# Patient Record
Sex: Male | Born: 1980 | Race: White | Hispanic: No | Marital: Single | Smoking: Never smoker
Health system: Southern US, Community
[De-identification: ages and names within clinical notes are randomized; demographics above are authoritative.]

## PROBLEM LIST (undated history)

## (undated) HISTORY — PX: TONSILLECTOMY: SUR1361

## (undated) HISTORY — PX: WISDOM TOOTH EXTRACTION: SHX21

---

## 2014-06-27 ENCOUNTER — Emergency Department (HOSPITAL_COMMUNITY): Payer: Self-pay

## 2014-06-27 ENCOUNTER — Emergency Department (HOSPITAL_COMMUNITY)
Admission: EM | Admit: 2014-06-27 | Discharge: 2014-06-27 | Disposition: A | Discharge status: Home / Self Care | Payer: Self-pay | Attending: Emergency Medicine | Admitting: Emergency Medicine

## 2014-06-27 ENCOUNTER — Encounter (HOSPITAL_COMMUNITY): Payer: Self-pay | Admitting: Emergency Medicine

## 2014-06-27 DIAGNOSIS — R05 Cough: Secondary | ICD-10-CM | POA: Insufficient documentation

## 2014-06-27 DIAGNOSIS — J069 Acute upper respiratory infection, unspecified: Secondary | ICD-10-CM | POA: Insufficient documentation

## 2014-06-27 DIAGNOSIS — R059 Cough, unspecified: Secondary | ICD-10-CM | POA: Insufficient documentation

## 2014-06-27 DIAGNOSIS — H9209 Otalgia, unspecified ear: Secondary | ICD-10-CM | POA: Insufficient documentation

## 2014-06-27 DIAGNOSIS — B9789 Other viral agents as the cause of diseases classified elsewhere: Secondary | ICD-10-CM

## 2014-06-27 MED ORDER — OXYMETAZOLINE HCL 0.05 % NA SOLN
1.0000 | Freq: Once | NASAL | Status: AC
  Administered 2014-06-27: 1 via NASAL
  Filled 2014-06-27: qty 15

## 2014-06-27 MED ORDER — BENZONATATE 100 MG PO CAPS: 100.0000 mg | ORAL_CAPSULE | Freq: Three times a day (TID) | ORAL | Status: AC

## 2014-06-27 NOTE — ED Notes (Signed)
Pt states for 2 days he has had cough, congestion and earache.  Also c/o lower bilateral rib cage pain.  Increased home vitamins without relief.  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  Feels fatigued.

## 2014-06-27 NOTE — ED Provider Notes (Signed)
CSN: [id number]     Arrival date & time 06/27/14  1738 History  This chart was scribed for non-physician practitioner, Jaynie Crumble, PA-C working with Merrie Roof, MD by Greggory Stallion, ED scribe. This patient was seen in room WTR8/WTR8 and the patient's care was started at 6:30 PM.   Chief Complaint  Patient presents with  . Cough  . Otalgia   The history is provided by the patient. No language interpreter was used.   HPI Comments: Andres Hill is a 33 y.o. male who presents to the Emergency Department complaining of cough, congestion and ear pain that started 2 days ago. Symptoms worsened yesterday. States generalized body aches and bilateral lower rib pain started earlier today. He had a fever of 102 earlier this morning. Pt has taken tylenol cold and flu, Dayquil and Nyquil with some relief. His last dose of Dayquil was around noon today. Reports mild sore throat yesterday but denies it today. Pt does not smoke cigarettes daily.   History reviewed. No pertinent past medical history. History reviewed. No pertinent past surgical history. History reviewed. No pertinent family history. History  Substance Use Topics  . Smoking status: Never Smoker   . Smokeless tobacco: Not on file  . Alcohol Use: Yes     Comment: occasional    Review of Systems  Constitutional: Positive for fever.  HENT: Positive for congestion and ear pain. Negative for sore throat.   Respiratory: Positive for cough.   Musculoskeletal: Positive for myalgias.  All other systems reviewed and are negative.  Allergies  Review of patient's allergies indicates no known allergies.  Home Medications   Prior to Admission medications   Not on File   BP 108/76  Pulse 65  Temp(Src) 98.4 F (36.9 C) (Oral)  Resp 18  SpO2 100%  Physical Exam  Nursing note and vitals reviewed. Constitutional: He is oriented to person, place, and time. He appears well-developed and well-nourished. No distress.  HENT:   Head: Normocephalic and atraumatic.  Right Ear: Tympanic membrane and ear canal normal.  Left Ear: Tympanic membrane and ear canal normal.  Nose: Mucosal edema and rhinorrhea present.  Mouth/Throat: Uvula is midline, oropharynx is clear and moist and mucous membranes are normal.  Eyes: Conjunctivae and EOM are normal.  Neck: Neck supple. No tracheal deviation present.  Cardiovascular: Normal rate, regular rhythm and normal heart sounds.   Pulmonary/Chest: Effort normal and breath sounds normal. No respiratory distress. He has no wheezes. He has no rhonchi. He has no rales.  Musculoskeletal: Normal range of motion.  Lymphadenopathy:    He has no cervical adenopathy.  Neurological: He is alert and oriented to person, place, and time.  Skin: Skin is warm and dry.  Psychiatric: He has a normal mood and affect. His behavior is normal.    ED Course  Procedures (including critical care time)  DIAGNOSTIC STUDIES: Oxygen Saturation is 100% on RA, normal by my interpretation.    COORDINATION OF CARE: 6:33 PM-Discussed treatment plan which includes chest xray with pt at bedside and pt agreed to plan.   Labs Review Labs Reviewed - No data to display  Imaging Review Dg Chest 2 View  06/27/2014   CLINICAL DATA:  Cough, abdominal cramping  EXAM: CHEST  2 VIEW  COMPARISON:  None  FINDINGS: The heart size and mediastinal contours are within normal limits. Both lungs are clear. The visualized skeletal structures are unremarkable.  IMPRESSION: No active cardiopulmonary disease.   Electronically Signed  By: Natasha MeadLiviu  Pop M.D.   On: 06/27/2014 19:23     EKG Interpretation None      MDM   Final diagnoses:  Viral URI with cough    Pt with with nasal congestion, cough, sinus pressure.  CXR negative. VS normal. Suspect viral URI. Home with afrin, PO fluids, tessalon. Pt denies sore throat, doubt strep pharyngitis. He is afebrile. No distress, non toxic. No sings of meningismus.   Filed Vitals:    06/27/14 1818  BP: 108/76  Pulse: 65  Temp: 98.4 F (36.9 C)  TempSrc: Oral  Resp: 18  SpO2: 100%    I personally performed the services described in this documentation, which was scribed in my presence. The recorded information has been reviewed and is accurate.  Lottie Musselatyana A Shandel Busic, PA-C 06/27/14 2031

## 2014-06-27 NOTE — Discharge Instructions (Signed)
Tessalon perles for cough. Continue dayquil and nyquil. Rest. Fluids. Afrin twice a day for 3 days only. Follow up with primary care doctor or urgent care    Upper Respiratory Infection, Adult An upper respiratory infection (URI) is also sometimes known as the common cold. The upper respiratory tract includes the nose, sinuses, throat, trachea, and bronchi. Bronchi are the airways leading to the lungs. Most people improve within 1 week, but symptoms can last up to 2 weeks. A residual cough may last even longer.  CAUSES Many different viruses can infect the tissues lining the upper respiratory tract. The tissues become irritated and inflamed and often become very moist. Mucus production is also common. A cold is contagious. You can easily spread the virus to others by oral contact. This includes kissing, sharing a glass, coughing, or sneezing. Touching your mouth or nose and then touching a surface, which is then touched by another person, can also spread the virus. SYMPTOMS  Symptoms typically develop 1 to 3 days after you come in contact with a cold virus. Symptoms vary from person to person. They may include:  Runny nose.  Sneezing.  Nasal congestion.  Sinus irritation.  Sore throat.  Loss of voice (laryngitis).  Cough.  Fatigue.  Muscle aches.  Loss of appetite.  Headache.  Low-grade fever. DIAGNOSIS  You might diagnose your own cold based on familiar symptoms, since most people get a cold 2 to 3 times a year. Your caregiver can confirm this based on your exam. Most importantly, your caregiver can check that your symptoms are not due to another disease such as strep throat, sinusitis, pneumonia, asthma, or epiglottitis. Blood tests, throat tests, and X-rays are not necessary to diagnose a common cold, but they may sometimes be helpful in excluding other more serious diseases. Your caregiver will decide if any further tests are required. RISKS AND COMPLICATIONS  You may be at  risk for a more severe case of the common cold if you smoke cigarettes, have chronic heart disease (such as heart failure) or lung disease (such as asthma), or if you have a weakened immune system. The very young and very old are also at risk for more serious infections. Bacterial sinusitis, middle ear infections, and bacterial pneumonia can complicate the common cold. The common cold can worsen asthma and chronic obstructive pulmonary disease (COPD). Sometimes, these complications can require emergency medical care and may be life-threatening. PREVENTION  The best way to protect against getting a cold is to practice good hygiene. Avoid oral or hand contact with people with cold symptoms. Wash your hands often if contact occurs. There is no clear evidence that vitamin C, vitamin E, echinacea, or exercise reduces the chance of developing a cold. However, it is always recommended to get plenty of rest and practice good nutrition. TREATMENT  Treatment is directed at relieving symptoms. There is no cure. Antibiotics are not effective, because the infection is caused by a virus, not by bacteria. Treatment may include:  Increased fluid intake. Sports drinks offer valuable electrolytes, sugars, and fluids.  Breathing heated mist or steam (vaporizer or shower).  Eating chicken soup or other clear broths, and maintaining good nutrition.  Getting plenty of rest.  Using gargles or lozenges for comfort.  Controlling fevers with ibuprofen or acetaminophen as directed by your caregiver.  Increasing usage of your inhaler if you have asthma. Zinc gel and zinc lozenges, taken in the first 24 hours of the common cold, can shorten the duration and lessen  the severity of symptoms. Pain medicines may help with fever, muscle aches, and throat pain. A variety of non-prescription medicines are available to treat congestion and runny nose. Your caregiver can make recommendations and may suggest nasal or lung inhalers for  other symptoms.  HOME CARE INSTRUCTIONS   Only take over-the-counter or prescription medicines for pain, discomfort, or fever as directed by your caregiver.  Use a warm mist humidifier or inhale steam from a shower to increase air moisture. This may keep secretions moist and make it easier to breathe.  Drink enough water and fluids to keep your urine clear or pale yellow.  Rest as needed.  Return to work when your temperature has returned to normal or as your caregiver advises. You may need to stay home longer to avoid infecting others. You can also use a face mask and careful hand washing to prevent spread of the virus. SEEK MEDICAL CARE IF:   After the first few days, you feel you are getting worse rather than better.  You need your caregiver's advice about medicines to control symptoms.  You develop chills, worsening shortness of breath, or brown or red sputum. These may be signs of pneumonia.  You develop yellow or brown nasal discharge or pain in the face, especially when you bend forward. These may be signs of sinusitis.  You develop a fever, swollen neck glands, pain with swallowing, or white areas in the back of your throat. These may be signs of strep throat. SEEK IMMEDIATE MEDICAL CARE IF:   You have a fever.  You develop severe or persistent headache, ear pain, sinus pain, or chest pain.  You develop wheezing, a prolonged cough, cough up blood, or have a change in your usual mucus (if you have chronic lung disease).  You develop sore muscles or a stiff neck. Document Released: 04/21/2001 Document Revised: 01/18/2012 Document Reviewed: 01/31/2014 [facility] Patient Information 2015 [place], [place]. This information is not intended to replace advice given to you by your health care provider. Make sure you discuss any questions you have with your health care provider.

## 2014-06-28 NOTE — ED Provider Notes (Signed)
Medical screening examination/treatment/procedure(s) were performed by non-physician practitioner and as supervising physician I was immediately available for consultation/collaboration.   EKG Interpretation None        Andres Hill David Yomaira Solar III, MD 06/28/14 0020 

## 2014-12-26 ENCOUNTER — Emergency Department (HOSPITAL_COMMUNITY)
Admission: EM | Admit: 2014-12-26 | Discharge: 2014-12-26 | Disposition: A | Discharge status: Home / Self Care | Payer: Self-pay | Attending: Emergency Medicine | Admitting: Emergency Medicine

## 2014-12-26 ENCOUNTER — Encounter (HOSPITAL_COMMUNITY): Payer: Self-pay | Admitting: Emergency Medicine

## 2014-12-26 DIAGNOSIS — M549 Dorsalgia, unspecified: Secondary | ICD-10-CM

## 2014-12-26 DIAGNOSIS — X58XXXA Exposure to other specified factors, initial encounter: Secondary | ICD-10-CM | POA: Insufficient documentation

## 2014-12-26 DIAGNOSIS — Z79899 Other long term (current) drug therapy: Secondary | ICD-10-CM | POA: Insufficient documentation

## 2014-12-26 DIAGNOSIS — Y998 Other external cause status: Secondary | ICD-10-CM | POA: Insufficient documentation

## 2014-12-26 DIAGNOSIS — Y9289 Other specified places as the place of occurrence of the external cause: Secondary | ICD-10-CM | POA: Insufficient documentation

## 2014-12-26 DIAGNOSIS — S46812A Strain of other muscles, fascia and tendons at shoulder and upper arm level, left arm, initial encounter: Secondary | ICD-10-CM | POA: Insufficient documentation

## 2014-12-26 DIAGNOSIS — M62838 Other muscle spasm: Secondary | ICD-10-CM | POA: Insufficient documentation

## 2014-12-26 DIAGNOSIS — Y9389 Activity, other specified: Secondary | ICD-10-CM | POA: Insufficient documentation

## 2014-12-26 DIAGNOSIS — M546 Pain in thoracic spine: Secondary | ICD-10-CM | POA: Insufficient documentation

## 2014-12-26 LAB — I-STAT CHEM 8, ED
BUN: 14 mg/dL (ref 6–23)
CHLORIDE: 99 mmol/L (ref 96–112)
CREATININE: 0.9 mg/dL (ref 0.50–1.35)
Calcium, Ion: 1.21 mmol/L (ref 1.12–1.23)
GLUCOSE: 132 mg/dL — AB (ref 70–99)
HCT: 51 % (ref 39.0–52.0)
Hemoglobin: 17.3 g/dL — ABNORMAL HIGH (ref 13.0–17.0)
Potassium: 3.8 mmol/L (ref 3.5–5.1)
Sodium: 140 mmol/L (ref 135–145)
TCO2: 24 mmol/L (ref 0–100)

## 2014-12-26 LAB — CK: Total CK: 110 U/L (ref 7–232)

## 2014-12-26 MED ORDER — METHOCARBAMOL 500 MG PO TABS: 500.0000 mg | ORAL_TABLET | Freq: Two times a day (BID) | ORAL | Status: AC | PRN

## 2014-12-26 MED ORDER — HYDROMORPHONE HCL 1 MG/ML IJ SOLN
0.5000 mg | Freq: Once | INTRAMUSCULAR | Status: AC
  Administered 2014-12-26: 0.5 mg via INTRAVENOUS
  Filled 2014-12-26: qty 1

## 2014-12-26 MED ORDER — NAPROXEN 500 MG PO TABS: 500.0000 mg | ORAL_TABLET | Freq: Two times a day (BID) | ORAL | Status: AC

## 2014-12-26 MED ORDER — DIAZEPAM 5 MG/ML IJ SOLN
2.5000 mg | Freq: Once | INTRAMUSCULAR | Status: AC
  Administered 2014-12-26: 2.5 mg via INTRAVENOUS
  Filled 2014-12-26: qty 2

## 2014-12-26 MED ORDER — OXYCODONE-ACETAMINOPHEN 5-325 MG PO TABS: 1.0000 | ORAL_TABLET | Freq: Four times a day (QID) | ORAL | Status: AC | PRN

## 2014-12-26 MED ORDER — KETOROLAC TROMETHAMINE 30 MG/ML IJ SOLN
30.0000 mg | Freq: Once | INTRAMUSCULAR | Status: AC
  Administered 2014-12-26: 30 mg via INTRAVENOUS
  Filled 2014-12-26: qty 1

## 2014-12-26 NOTE — ED Notes (Signed)
Pt ambulatory and states he is ready to go home.

## 2014-12-26 NOTE — ED Notes (Addendum)
Pt c/o  Left upper back pain/spasm that moves down under arm pit and on bicep and tricep of left arm.  Pt has visual twitching of muscles in triage chair in left arm.

## 2014-12-26 NOTE — ED Notes (Signed)
Nurse currently starting IV 

## 2014-12-26 NOTE — Discharge Instructions (Signed)
Muscle Strain A muscle strain is an injury that occurs when a muscle is stretched beyond its normal length. Usually a small number of muscle fibers are torn when this happens. Muscle strain is rated in degrees. First-degree strains have the least amount of muscle fiber tearing and pain. Second-degree and third-degree strains have increasingly more tearing and pain.  Usually, recovery from muscle strain takes 1-2 weeks. Complete healing takes 5-6 weeks.  CAUSES  Muscle strain happens when a sudden, violent force placed on a muscle stretches it too far. This may occur with lifting, sports, or a fall.  RISK FACTORS Muscle strain is especially common in athletes.  SIGNS AND SYMPTOMS At the site of the muscle strain, there may be:  Pain.  Bruising.  Swelling.  Difficulty using the muscle due to pain or lack of normal function. DIAGNOSIS  Your health care provider will perform a physical exam and ask about your medical history. TREATMENT  Often, the best treatment for a muscle strain is resting, icing, and applying cold compresses to the injured area.  HOME CARE INSTRUCTIONS   Use the PRICE method of treatment to promote muscle healing during the first 2-3 days after your injury. The PRICE method involves:  Protecting the muscle from being injured again.  Restricting your activity and resting the injured body part.  Icing your injury. To do this, put ice in a plastic bag. Place a towel between your skin and the bag. Then, apply the ice and leave it on from 15-20 minutes each hour. After the third day, switch to moist heat packs.  Apply compression to the injured area with a splint or elastic bandage. Be careful not to wrap it too tightly. This may interfere with blood circulation or increase swelling.  Elevate the injured body part above the level of your heart as often as you can.  Only take over-the-counter or prescription medicines for pain, discomfort, or fever as directed by your  health care provider.  Warming up prior to exercise helps to prevent future muscle strains. SEEK MEDICAL CARE IF:   You have increasing pain or swelling in the injured area.  You have numbness, tingling, or a significant loss of strength in the injured area. MAKE SURE YOU:   Understand these instructions.  Will watch your condition.  Will get help right away if you are not doing well or get worse. Document Released: 10/26/2005 Document Revised: 08/16/2013 Document Reviewed: 05/25/2013 [facility] Patient Information 2015 [place], [place]. This information is not intended to replace advice given to you by your health care provider. Make sure you discuss any questions you have with your health care provider. Muscle Cramps and Spasms Muscle cramps and spasms occur when a muscle or muscles tighten and you have no control over this tightening (involuntary muscle contraction). They are a common problem and can develop in any muscle. The most common place is in the calf muscles of the leg. Both muscle cramps and muscle spasms are involuntary muscle contractions, but they also have differences:   Muscle cramps are sporadic and painful. They may last a few seconds to a quarter of an hour. Muscle cramps are often more forceful and last longer than muscle spasms.  Muscle spasms may or may not be painful. They may also last just a few seconds or much longer. CAUSES  It is uncommon for cramps or spasms to be due to a serious underlying problem. In many cases, the cause of cramps or spasms is unknown. Some common  causes are:   Overexertion.   Overuse from repetitive motions (doing the same thing over and over).   Remaining in a certain position for a long period of time.   Improper preparation, form, or technique while performing a sport or activity.   Dehydration.   Injury.   Side effects of some medicines.   Abnormally low levels of the salts and ions in your blood (electrolytes),  especially potassium and calcium. This could happen if you are taking water pills (diuretics) or you are pregnant.  Some underlying medical problems can make it more likely to develop cramps or spasms. These include, but are not limited to:   Diabetes.   Parkinson disease.   Hormone disorders, such as thyroid problems.   Alcohol abuse.   Diseases specific to muscles, joints, and bones.   Blood vessel disease where not enough blood is getting to the muscles.  HOME CARE INSTRUCTIONS   Stay well hydrated. Drink enough water and fluids to keep your urine clear or pale yellow.  It may be helpful to massage, stretch, and relax the affected muscle.  For tight or tense muscles, use a warm towel, heating pad, or hot shower water directed to the affected area.  If you are sore or have pain after a cramp or spasm, applying ice to the affected area may relieve discomfort.  Put ice in a plastic bag.  Place a towel between your skin and the bag.  Leave the ice on for 15-20 minutes, 03-04 times a day.  Medicines used to treat a known cause of cramps or spasms may help reduce their frequency or severity. Only take over-the-counter or prescription medicines as directed by your caregiver. SEEK MEDICAL CARE IF:  Your cramps or spasms get more severe, more frequent, or do not improve over time.  MAKE SURE YOU:   Understand these instructions.  Will watch your condition.  Will get help right away if you are not doing well or get worse. Document Released: 04/17/2002 Document Revised: 02/20/2013 Document Reviewed: 10/12/2012 [facility] CenterExitCare Patient Information 2015 [place], [place]. This information is not intended to replace advice given to you by your health care provider. Make sure you discuss any questions you have with your health care provider.

## 2014-12-26 NOTE — ED Provider Notes (Signed)
CSN: [id number]     Arrival date & time 12/26/14  1906 History  This chart was scribed for non-physician practitioner, Antony MaduraKelly Zainab Crumrine, PA working with Suzi RootsKevin E Steinl, MD by Gwenyth Oberatherine Macek, ED scribe. This patient was seen in room WTR8/WTR8 and the patient's care was started at 8:14 PM  Chief Complaint  Patient presents with  . Back Pain  . Arm Pain   The history is provided by the patient. No language interpreter was used.    HPI Comments: Andres PoissonSteven Hill is a 34 y.o. male who presents to the Emergency Department complaining of constant, gradually worsening, sharp left upper back pain that radiates to his left arm and started this morning. Pt notes an intermittent pulsating sensation in his left arm muscles and mild cough that started 1 month ago as associated symptoms. He reports pain becomes worse with muscle spasms, pushing and pulling with his left arm and hyperextension of his neck. He denies alleviating factors. Pt has tried rest, neck stretches, epsom salt baths and 1000 mg of Ibuprofen with no relief. He denies recent injury, but notes increased stress in the last week. Pt drinks 1 gallon of water daily. Pt has a history of a partial bicep tear in left arm that occurred a few years ago and was treated with rehab. He denies fever and numbness as associated symptoms.  History reviewed. No pertinent past medical history. History reviewed. No pertinent past surgical history. No family history on file. History  Substance Use Topics  . Smoking status: Never Smoker   . Smokeless tobacco: Not on file  . Alcohol Use: Yes     Comment: occasional    Review of Systems  Constitutional: Negative for fever.  Musculoskeletal: Positive for myalgias and back pain.  Skin: Negative for wound.  Neurological: Negative for numbness.  All other systems reviewed and are negative.   Allergies  Review of patient's allergies indicates no known allergies.  Home Medications   Prior to Admission medications    Medication Sig Start Date End Date Taking? Authorizing Provider  ibuprofen (ADVIL,MOTRIN) 200 MG tablet Take 400 mg by mouth every 6 (six) hours as needed for moderate pain.   Yes Historical Provider, MD  benzonatate (TESSALON) 100 MG capsule Take 1 capsule (100 mg total) by mouth every 8 (eight) hours. Patient not taking: Reported on 12/26/2014 06/27/14   Tatyana A Kirichenko, PA-C  methocarbamol (ROBAXIN) 500 MG tablet Take 1 tablet (500 mg total) by mouth every 12 (twelve) hours as needed for muscle spasms. 12/26/14   Antony MaduraKelly Ashlinn Hemrick, PA-C  naproxen (NAPROSYN) 500 MG tablet Take 1 tablet (500 mg total) by mouth 2 (two) times daily. 12/26/14   Antony MaduraKelly Bryndan Bilyk, PA-C  oxyCODONE-acetaminophen (PERCOCET/ROXICET) 5-325 MG per tablet Take 1-2 tablets by mouth every 6 (six) hours as needed for moderate pain or severe pain. 12/26/14   Antony MaduraKelly Ludivina Guymon, PA-C   BP 118/73 mmHg  Pulse 68  Temp(Src) 98.3 F (36.8 C) (Oral)  Resp 14  Ht 5\' 10"  (1.778 m)  Wt 167 lb (75.751 kg)  BMI 23.96 kg/m2  SpO2 98%   Physical Exam  Constitutional: He is oriented to person, place, and time. He appears well-developed and well-nourished. No distress.  Nontoxic/nonseptic appearing  HENT:  Head: Normocephalic and atraumatic.  Eyes: Conjunctivae and EOM are normal. No scleral icterus.  Neck: Normal range of motion.  Cardiovascular: Normal rate, regular rhythm and intact distal pulses.   Distal radial pulse 2+ bilaterally  Pulmonary/Chest: Effort normal. No respiratory distress.  Respirations even and unlabored  Musculoskeletal: Normal range of motion. He exhibits tenderness.       Back:  There is to palpation along the course of the left trapezius muscle. No tenderness to the cervical, thoracic, or lumbar midline. No bony deformities, step-offs, or crepitus. Involuntary muscle spasms noted to various points of the left upper extremity. No tenderness to the proximal or distal left arm.  Neurological: He is alert and oriented to  person, place, and time. He exhibits normal muscle tone. Coordination normal.  Sensation to light touch intact. Equal grip strength bilaterally. Strength against resistance 4/5 in the left upper extremity compared to right. Suspect that this is secondary to discomfort as patient appears to be in pain when performing these tests.  Skin: Skin is warm and dry. No rash noted. He is not diaphoretic. No erythema. No pallor.  Psychiatric: He has a normal mood and affect. His behavior is normal.  Nursing note and vitals reviewed.   ED Course  Procedures (including critical care time) DIAGNOSTIC STUDIES: Oxygen Saturation is 100% on RA, normal by my interpretation.    COORDINATION OF CARE: 8:26 PM Discussed treatment plan with pt which includes lab work. He agreed to plan.  Labs Review Labs Reviewed  I-STAT CHEM 8, ED - Abnormal; Notable for the following:    Glucose, Bld 132 (*)    Hemoglobin 17.3 (*)    All other components within normal limits  CK   Imaging Review No results found.   EKG Interpretation None      MDM   Final diagnoses:  Muscle spasm  Trapezius strain, left, initial encounter  Upper back pain on left side    34 year old male presents to the emergency department for further evaluation of pain to his left upper back. No known trauma or injury. No bony tenderness or deformity. Patient is neurovascularly intact. He has tenderness along the course of the left trapezius muscle. No tenderness to the left upper extremity, but involuntary spasms are noted sporadically. Patient has no electrolyte balance today. CK is normal. Symptoms treated in ED with Valium, Dilaudid, and Toradol as well as heat pack. No indication for further emergent workup at this time. Will continue pain control as outpatient with Percocet, naproxen, and Robaxin. Return precautions provided. Patient discharged in good condition with no unaddressed concerns.  I personally performed the services described  in this documentation, which was scribed in my presence. The recorded information has been reviewed and is accurate.   Filed Vitals:   12/26/14 1913 12/26/14 2153  BP: 116/90 118/73  Pulse: 82 68  Temp: 98 F (36.7 C) 98.3 F (36.8 C)  TempSrc: Oral Oral  Resp: 20 14  Height:  (1.778 m)   Weight: 167 lb (75.751 kg)   SpO2: 100% 98%     Antony Madura, PA-C 12/27/14 0006  Suzi Roots, MD 12/29/14 [phone]

## 2014-12-27 ENCOUNTER — Emergency Department (HOSPITAL_COMMUNITY): Payer: Self-pay

## 2014-12-27 ENCOUNTER — Emergency Department (HOSPITAL_COMMUNITY)
Admission: EM | Admit: 2014-12-27 | Discharge: 2014-12-27 | Disposition: A | Discharge status: Home / Self Care | Payer: Self-pay | Attending: Emergency Medicine | Admitting: Emergency Medicine

## 2014-12-27 ENCOUNTER — Encounter (HOSPITAL_COMMUNITY): Payer: Self-pay | Admitting: Emergency Medicine

## 2014-12-27 DIAGNOSIS — M62838 Other muscle spasm: Secondary | ICD-10-CM

## 2014-12-27 DIAGNOSIS — M549 Dorsalgia, unspecified: Secondary | ICD-10-CM | POA: Insufficient documentation

## 2014-12-27 DIAGNOSIS — Z79899 Other long term (current) drug therapy: Secondary | ICD-10-CM | POA: Insufficient documentation

## 2014-12-27 MED ORDER — KETOROLAC TROMETHAMINE 30 MG/ML IJ SOLN
30.0000 mg | Freq: Once | INTRAMUSCULAR | Status: AC
  Administered 2014-12-27: 30 mg via INTRAMUSCULAR
  Filled 2014-12-27: qty 1

## 2014-12-27 NOTE — Discharge Instructions (Signed)
Muscle Cramps and Spasms Muscle cramps and spasms occur when a muscle or muscles tighten and you have no control over this tightening (involuntary muscle contraction). They are a common problem and can develop in any muscle. The most common place is in the calf muscles of the leg. Both muscle cramps and muscle spasms are involuntary muscle contractions, but they also have differences:   Muscle cramps are sporadic and painful. They may last a few seconds to a quarter of an hour. Muscle cramps are often more forceful and last longer than muscle spasms.  Muscle spasms may or may not be painful. They may also last just a few seconds or much longer. CAUSES  It is uncommon for cramps or spasms to be due to a serious underlying problem. In many cases, the cause of cramps or spasms is unknown. Some common causes are:   Overexertion.   Overuse from repetitive motions (doing the same thing over and over).   Remaining in a certain position for a long period of time.   Improper preparation, form, or technique while performing a sport or activity.   Dehydration.   Injury.   Side effects of some medicines.   Abnormally low levels of the salts and ions in your blood (electrolytes), especially potassium and calcium. This could happen if you are taking water pills (diuretics) or you are pregnant.  Some underlying medical problems can make it more likely to develop cramps or spasms. These include, but are not limited to:   Diabetes.   Parkinson disease.   Hormone disorders, such as thyroid problems.   Alcohol abuse.   Diseases specific to muscles, joints, and bones.   Blood vessel disease where not enough blood is getting to the muscles.  HOME CARE INSTRUCTIONS   Stay well hydrated. Drink enough water and fluids to keep your urine clear or pale yellow.  It may be helpful to massage, stretch, and relax the affected muscle.  For tight or tense muscles, use a warm towel, heating  pad, or hot shower water directed to the affected area.  If you are sore or have pain after a cramp or spasm, applying ice to the affected area may relieve discomfort.  Put ice in a plastic bag.  Place a towel between your skin and the bag.  Leave the ice on for 15-20 minutes, 03-04 times a day.  Medicines used to treat a known cause of cramps or spasms may help reduce their frequency or severity. Only take over-the-counter or prescription medicines as directed by your caregiver. SEEK MEDICAL CARE IF:  Your cramps or spasms get more severe, more frequent, or do not improve over time.  MAKE SURE YOU:   Understand these instructions.  Will watch your condition.  Will get help right away if you are not doing well or get worse. Document Released: 04/17/2002 Document Revised: 02/20/2013 Document Reviewed: 10/12/2012 [facility] Patient Information 2015 [place], [place]. This information is not intended to replace advice given to you by your health care provider. Make sure you discuss any questions you have with your health care provider.  Heat Therapy Heat therapy can help ease sore, stiff, injured, and tight muscles and joints. Heat relaxes your muscles, which may help ease your pain.  RISKS AND COMPLICATIONS If you have any of the following conditions, do not use heat therapy unless your health care provider has approved:  Poor circulation.  Healing wounds or scarred skin in the area being treated.  Diabetes, heart disease, or high  blood pressure.  Not being able to feel (numbness) the area being treated.  Unusual swelling of the area being treated.  Active infections.  Blood clots.  Cancer.  Inability to communicate pain. This may include young children and people who have problems with their brain function (dementia).  Pregnancy. Heat therapy should only be used on old, pre-existing, or long-lasting (chronic) injuries. Do not use heat therapy on new injuries unless directed  by your health care provider. HOW TO USE HEAT THERAPY There are several different kinds of heat therapy, including:  Moist heat pack.  Warm water bath.  Hot water bottle.  Electric heating pad.  Heated gel pack.  Heated wrap.  Electric heating pad. Use the heat therapy method suggested by your health care provider. Follow your health care provider's instructions on when and how to use heat therapy. GENERAL HEAT THERAPY RECOMMENDATIONS  Do not sleep while using heat therapy. Only use heat therapy while you are awake.  Your skin may turn pink while using heat therapy. Do not use heat therapy if your skin turns red.  Do not use heat therapy if you have new pain.  High heat or long exposure to heat can cause burns. Be careful when using heat therapy to avoid burning your skin.  Do not use heat therapy on areas of your skin that are already irritated, such as with a rash or sunburn. SEEK MEDICAL CARE IF:  You have blisters, redness, swelling, or numbness.  You have new pain.  Your pain is worse. MAKE SURE YOU:  Understand these instructions.  Will watch your condition.  Will get help right away if you are not doing well or get worse. Document Released: 01/18/2012 Document Revised: 03/12/2014 Document Reviewed: 12/19/2013 [facility] Patient Information 2015 [place]. This information is not intended to replace advice given to you by your health care provider. Make sure you discuss any questions you have with your health care provider. Your neck x-ray is normal, you can safely increase the Robaxin to 3 times a day.  Please use heat on a regular basis

## 2014-12-27 NOTE — ED Provider Notes (Signed)
CSN: [id number]     Arrival date & time 12/27/14  1956 History  This chart was scribed for non-physician practitioner, Arman Filter, NP, working with Donnetta Hutching, MD, by Roxy Cedar ED Scribe. This patient was seen in room WTR5/WTR5 and the patient's care was started at 8:21 PM   Chief Complaint  Patient presents with  . Spasms  . Arm Pain  . Back Pain   Patient is a 34 y.o. male presenting with arm pain and back pain. The history is provided by the patient. No language interpreter was used.  Arm Pain This is a recurrent problem. The problem occurs constantly. Pertinent negatives include no chest pain, no abdominal pain and no headaches. Nothing relieves the symptoms. The treatment provided no relief.  Back Pain Associated symptoms: weakness   Associated symptoms: no abdominal pain, no chest pain and no headaches     HPI Comments: Andres Hill is a 34 y.o. male who presents to the Emergency Department complaining of recurrent left shoulder pain. Patient was seen yesterday and was given pain medications, antiinflammatory and muscle relaxer. He states that he was unable to fill the medications at the pharmacy last night. He started taking the medication earlier today. He reports increased pain to left shoulder with onset of cramping to left hand. He reports associated trouble with grip in left hand. He states that the medication he took today has made him more sleepy but is not relieving pain or cramping.  History reviewed. No pertinent past medical history. History reviewed. No pertinent past surgical history. No family history on file. History  Substance Use Topics  . Smoking status: Never Smoker   . Smokeless tobacco: Not on file  . Alcohol Use: Yes     Comment: occasional   Review of Systems  Cardiovascular: Negative for chest pain.  Gastrointestinal: Negative for abdominal pain.  Musculoskeletal: Positive for back pain and neck pain.  Neurological: Positive for weakness.  Negative for headaches.  All other systems reviewed and are negative.  Allergies  Review of patient's allergies indicates no known allergies.  Home Medications   Prior to Admission medications   Medication Sig Start Date End Date Taking? Authorizing Provider  ibuprofen (ADVIL,MOTRIN) 200 MG tablet Take 400 mg by mouth every 6 (six) hours as needed for moderate pain (pain).    Yes Historical Provider, MD  methocarbamol (ROBAXIN) 500 MG tablet Take 1 tablet (500 mg total) by mouth every 12 (twelve) hours as needed for muscle spasms. 12/26/14  Yes Antony Madura, PA-C  naproxen (NAPROSYN) 500 MG tablet Take 1 tablet (500 mg total) by mouth 2 (two) times daily. 12/26/14  Yes Antony Madura, PA-C  oxyCODONE-acetaminophen (PERCOCET/ROXICET) 5-325 MG per tablet Take 1-2 tablets by mouth every 6 (six) hours as needed for moderate pain or severe pain. 12/26/14  Yes Antony Madura, PA-C  benzonatate (TESSALON) 100 MG capsule Take 1 capsule (100 mg total) by mouth every 8 (eight) hours. Patient not taking: Reported on 12/26/2014 06/27/14   Tatyana A Kirichenko, PA-C   Triage Vitals: BP 134/79 mmHg  Pulse 76  Temp(Src) 98.1 F (36.7 C) (Oral)  Resp 16  SpO2 100%  Physical Exam  Constitutional: He is oriented to person, place, and time. He appears well-developed and well-nourished. No distress.  HENT:  Head: Normocephalic and atraumatic.  Eyes: EOM are normal.  Neck: Normal range of motion. Neck supple. Tracheal deviation present.  Cardiovascular: Normal rate.   Pulmonary/Chest: Effort normal. No respiratory distress.  Musculoskeletal: Normal  range of motion.  Neurological: He is alert and oriented to person, place, and time. Coordination normal.  Skin: Skin is warm and dry.  Psychiatric: He has a normal mood and affect. His behavior is normal.  Nursing note and vitals reviewed.  ED Course  Procedures (including critical care time)  DIAGNOSTIC STUDIES: Oxygen Saturation is 100% on RA, normal by my  interpretation.    COORDINATION OF CARE: 8:26 PM- Discussed plans to order diagnostic imaging of cervical spine. Will give patient 30mg  Toradol injection. Pt advised of plan for treatment and pt agrees.  Labs Review Labs Reviewed - No data to display  Imaging Review Dg Cervical Spine Complete  12/27/2014   CLINICAL DATA:  Onset of intense left neck and shoulder pain 2 days ago with no known injury.  EXAM: CERVICAL SPINE  4+ VIEWS  COMPARISON:  None.  FINDINGS: The neck is in mild flexion. Vertebral body height and alignment are maintained. Mild loss of disc space height is noted at C4-5 and C5-6. Neural foramina appear widely patent at all levels. The facet joints are unremarkable. Prevertebral soft tissues appear normal. Lung apices are clear.  IMPRESSION: The neck is in mild flexion which could be secondary to positioning or muscle spasm.  No acute bony abnormality is seen. Minimal degenerative change is noted.   Electronically Signed   By: Drusilla Kannerhomas  Dalessio M.D.   On: 12/27/2014 20:59     EKG Interpretation None     I recommend the patient increase his Robaxin to 3 times a day.  Use heat compresses on a regular basis.  I've taken out of work until Monday MDM   Final diagnoses:  Muscle spasms of neck    I personally performed the services described in this documentation, which was scribed in my presence. The recorded information has been reviewed and is accurate.  Arman FilterGail K Mossie Gilder, NP 12/27/14 2111  Arman FilterGail K Nancye Grumbine, NP 12/27/14 2112  Donnetta HutchingBrian Cook, MD 12/29/14 [phone]

## 2014-12-27 NOTE — ED Notes (Signed)
Pt seen here yesterday for cramping in upper back with spasms in L arm and hand. Pt was given muscle relaxants, pain medication and anti inflammatories.  Pt sts he was not seen by a PA or MD last night, he was "only seen by a nurse."  Pt sts the medications aren't helping and pain is actually worse. Pt sts he is unable to grip things with his L hand. Pt actively gripping seat with L hand. Pt A&Ox4. Ambulatory to triage. Pt also sts I can't sit up, but sat up to take his jacket off.

## 2015-07-16 IMAGING — CR DG CHEST 2V
2 series · 2 of 2 positions shown · non-contrast
Comparison: None

CLINICAL DATA: Cough, abdominal cramping

EXAM:
CHEST  2 VIEW

[w chest pa]
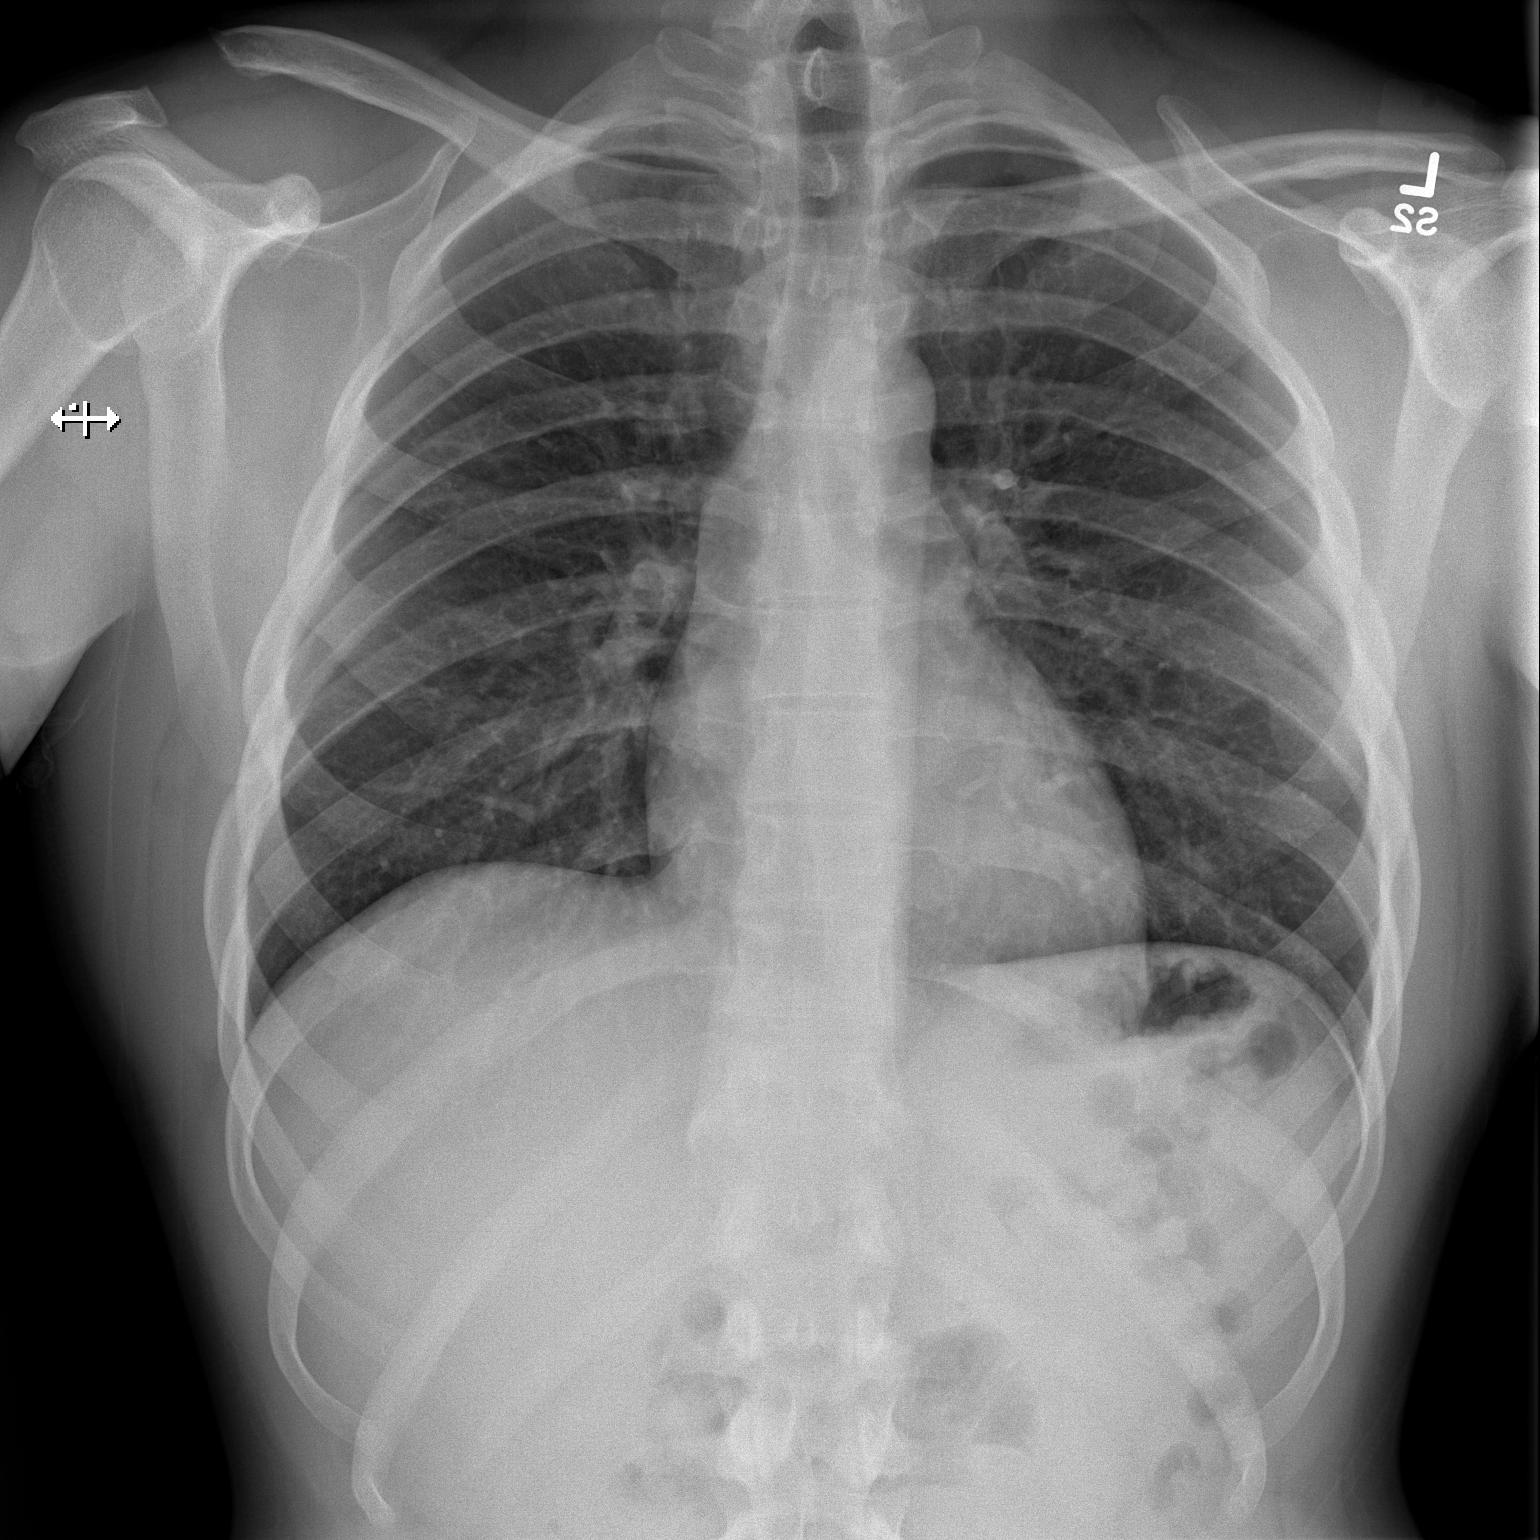

[w chest lat]
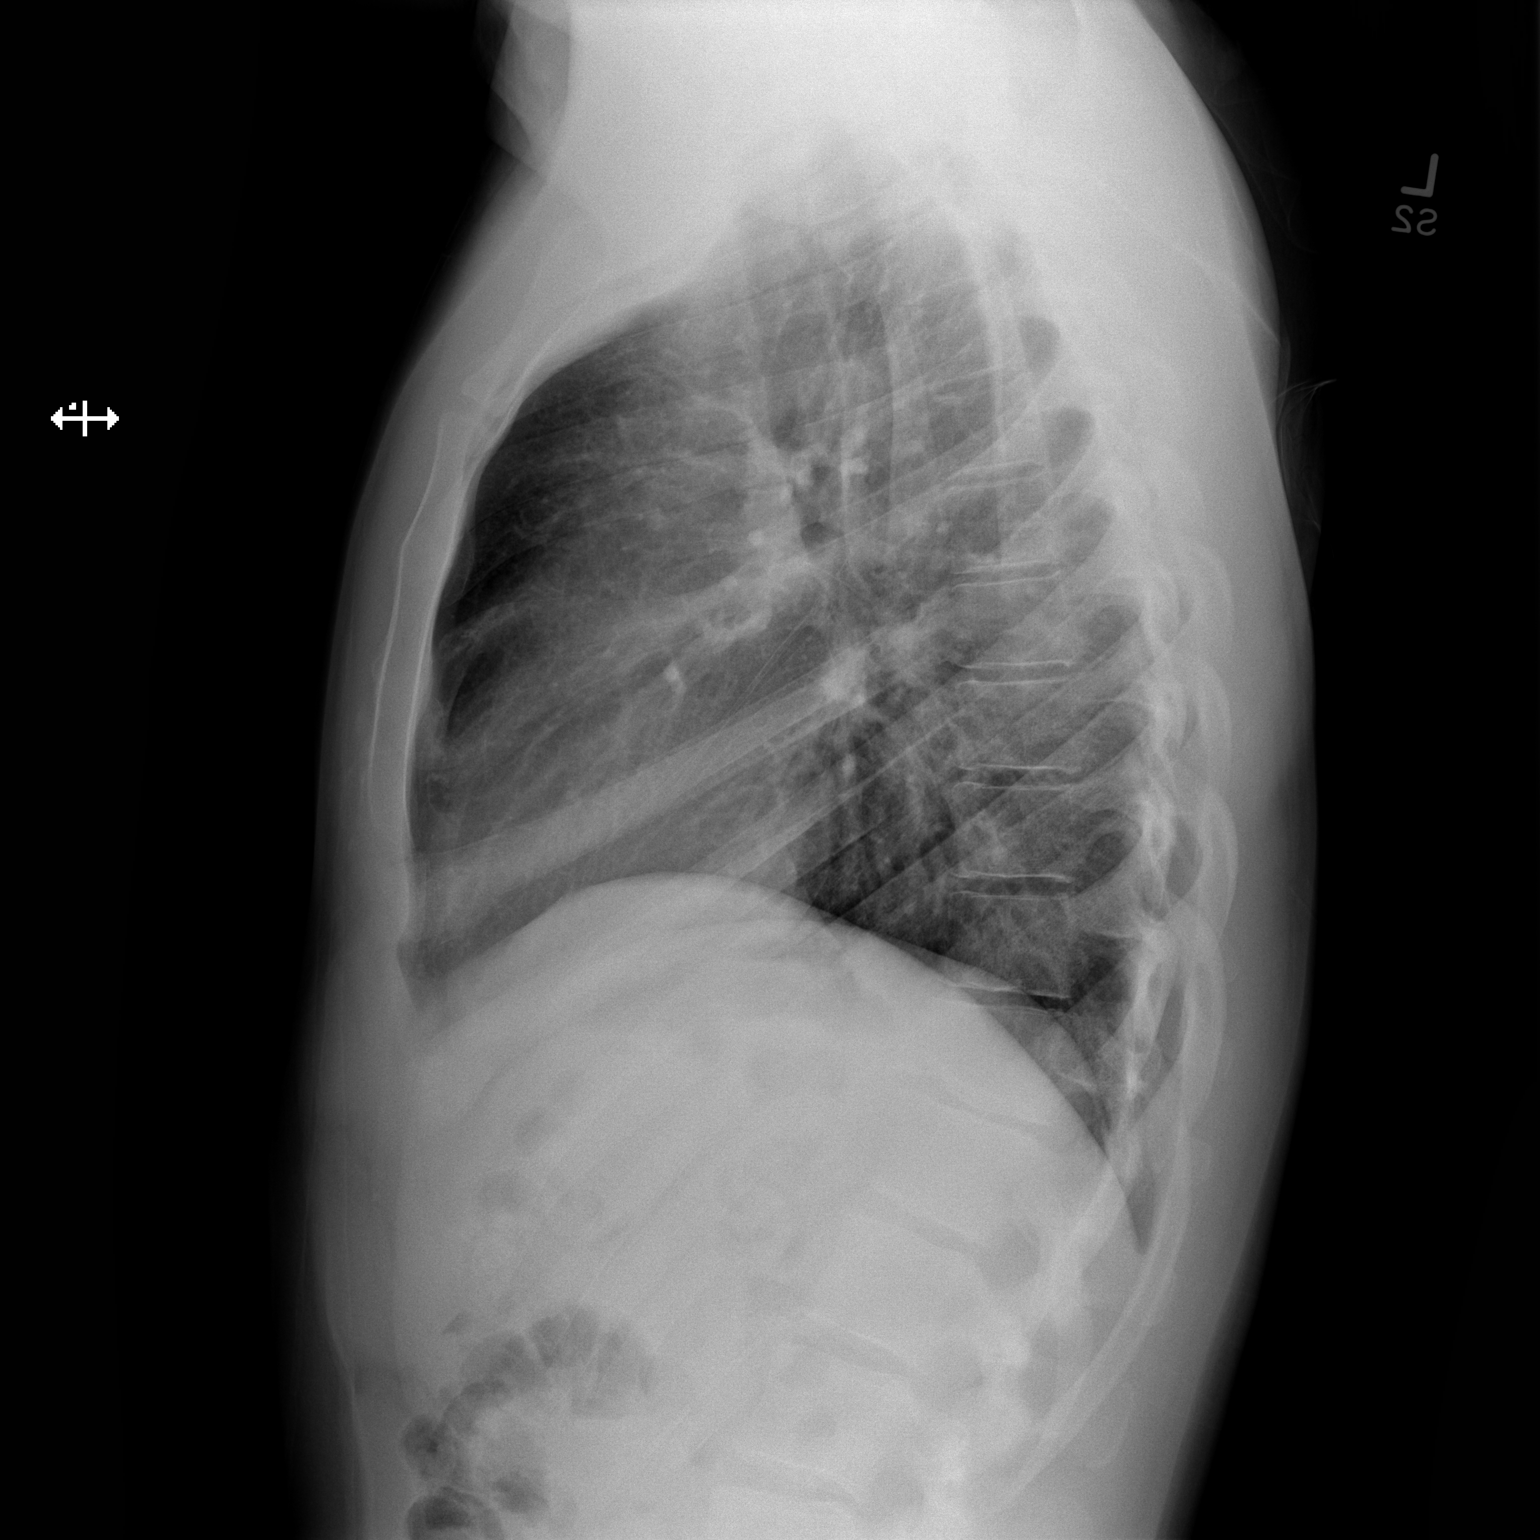

[2 of 2 positions shown; findings below may reference images not displayed]

FINDINGS: The heart size and mediastinal contours are within normal limits.
Both lungs are clear. The visualized skeletal structures are
unremarkable.
IMPRESSION: No active cardiopulmonary disease.

## 2017-08-31 ENCOUNTER — Emergency Department (HOSPITAL_COMMUNITY): Payer: Self-pay

## 2017-08-31 ENCOUNTER — Encounter (HOSPITAL_COMMUNITY): Payer: Self-pay | Admitting: Emergency Medicine

## 2017-08-31 ENCOUNTER — Emergency Department (HOSPITAL_COMMUNITY)
Admission: EM | Admit: 2017-08-31 | Discharge: 2017-08-31 | Disposition: A | Discharge status: Home / Self Care | Payer: Self-pay | Attending: Emergency Medicine | Admitting: Emergency Medicine

## 2017-08-31 DIAGNOSIS — Z79899 Other long term (current) drug therapy: Secondary | ICD-10-CM | POA: Insufficient documentation

## 2017-08-31 DIAGNOSIS — R2232 Localized swelling, mass and lump, left upper limb: Secondary | ICD-10-CM | POA: Insufficient documentation

## 2017-08-31 DIAGNOSIS — X509XXA Other and unspecified overexertion or strenuous movements or postures, initial encounter: Secondary | ICD-10-CM | POA: Insufficient documentation

## 2017-08-31 DIAGNOSIS — Y929 Unspecified place or not applicable: Secondary | ICD-10-CM | POA: Insufficient documentation

## 2017-08-31 DIAGNOSIS — S5002XA Contusion of left elbow, initial encounter: Secondary | ICD-10-CM | POA: Insufficient documentation

## 2017-08-31 DIAGNOSIS — Y998 Other external cause status: Secondary | ICD-10-CM | POA: Insufficient documentation

## 2017-08-31 DIAGNOSIS — M25422 Effusion, left elbow: Secondary | ICD-10-CM

## 2017-08-31 DIAGNOSIS — Y9389 Activity, other specified: Secondary | ICD-10-CM | POA: Insufficient documentation

## 2017-08-31 NOTE — ED Triage Notes (Signed)
Pt stated that he hyperextended /elbpow while doing martial arts last night. L/elbow shows deep bruising, with full ROM

## 2017-08-31 NOTE — ED Provider Notes (Signed)
[facility]-EMERGENCY DEPT Provider Note   CSN: [id number] Arrival date & time: 08/31/17  1537     History   Chief Complaint Chief Complaint  Patient presents with  . Arm Pain    HPI Andres Hill is a 36 y.o. male.  HPI  Andres Hill is a 36 year old male with no significant past medical history who presents the emergency department for evaluation of left elbow injury which occurred yesterday. He states that he was doing jujitsu and hyperextended his left elbow. At the time of the injury he states that he heard a popping noise. Has developed a bruise on the medial aspect of the left elbow with swelling since time of the injury. He endorses mild pain last night in which she took 800 mg ibuprofen. He has also been icing the elbow which has improved the swelling. He brought a elbow sleeve for stability which she has been using. Today he denies pain in the left elbow, states that it feels "tight" when he hyperextends. Denies numbness, weakness, fever, open wound. States that he was seen at [facility] emergency department and was told to put the arm in a sling. Is asking for a note to return to work.  History reviewed. No pertinent past medical history.  There are no active problems to display for this patient.   Past Surgical History:  Procedure Laterality Date  . TONSILLECTOMY    . WISDOM TOOTH EXTRACTION         Home Medications    Prior to Admission medications   Medication Sig Start Date End Date Taking? Authorizing Provider  benzonatate (TESSALON) 100 MG capsule Take 1 capsule (100 mg total) by mouth every 8 (eight) hours. Patient not taking: Reported on 12/26/2014 06/27/14   Jaynie Crumble, PA-C  ibuprofen (ADVIL,MOTRIN) 200 MG tablet Take 400 mg by mouth every 6 (six) hours as needed for moderate pain (pain).     [provider]  methocarbamol (ROBAXIN) 500 MG tablet Take 1 tablet (500 mg total) by mouth every 12 (twelve) hours as  needed for muscle spasms. 12/26/14   Antony Madura, PA-C  naproxen (NAPROSYN) 500 MG tablet Take 1 tablet (500 mg total) by mouth 2 (two) times daily. 12/26/14   Antony Madura, PA-C  oxyCODONE-acetaminophen (PERCOCET/ROXICET) 5-325 MG per tablet Take 1-2 tablets by mouth every 6 (six) hours as needed for moderate pain or severe pain. 12/26/14   Antony Madura, PA-C    Family History History reviewed. No pertinent family history.  Social History Social History  Substance Use Topics  . Smoking status: Never Smoker  . Smokeless tobacco: Never Used  . Alcohol use No     Allergies   Patient has no known allergies.   Review of Systems Review of Systems  Constitutional: Negative for chills, fatigue and fever.  Gastrointestinal: Negative for abdominal pain, nausea and vomiting.  Musculoskeletal: Positive for joint swelling (left elbow). Negative for arthralgias and myalgias.  Skin: Positive for color change (left elbow ecchymosis). Negative for rash and wound.  Neurological: Negative for dizziness, weakness, light-headedness and numbness.     Physical Exam Updated Vital Signs BP 124/90 (BP Location: Left Arm)   Pulse 71   Temp 97.7 F (36.5 C) (Oral)   Resp 18   Wt 79.8 kg (176 lb)   SpO2 99%   BMI 25.25 kg/m   Physical Exam  Constitutional: He is oriented to person, place, and time. He appears well-developed and well-nourished. No distress.  HENT:  Head: Normocephalic and atraumatic.  Eyes: Right eye exhibits no discharge. Left eye exhibits no discharge.  Pulmonary/Chest: Effort normal. No respiratory distress.  Musculoskeletal:  Left medial elbow with ecchymoses and mild swelling. No tenderness over the joint. Full ROM of left elbow, shoulder and wrist joint without pain. 5/5 strength in bilateral UE. Radial pulses 2+ bilaterally.  Neurological: He is alert and oriented to person, place, and time. Coordination normal.  Distal sensation intact to sharp/light touch in bilateral  upper extremities.  Skin: Skin is warm and dry. Capillary refill takes less than 2 seconds. He is not diaphoretic.  Ecchymoses present on medial aspect of left elbow joint.  Psychiatric: He has a normal mood and affect. His behavior is normal.  Nursing note and vitals reviewed.    ED Treatments / Results  Labs (all labs ordered are listed, but only abnormal results are displayed) Labs Reviewed - No data to display  EKG  EKG Interpretation None       Radiology Dg Elbow Complete Left  Result Date: 08/31/2017 CLINICAL DATA:  Hyperextension injury yesterday while performing martial arts with pain, initial encounter EXAM: LEFT ELBOW - COMPLETE 3+ VIEW COMPARISON:  [facility] FINDINGS: Mild posterior soft tissue swelling is noted. The joint effusion is less well visualized on the current examination. Very mild spurring of the olecranon is again noted. No acute fracture or dislocation is seen. No other focal abnormality is noted. IMPRESSION: Posterior soft tissue swelling. Previously seen joint effusion is not as well visualized on this exam. Again no acute bony abnormality is noted. Electronically Signed   By: Alcide CleverMark  Lukens M.D.   On: 08/31/2017 16:26    Procedures Procedures (including critical care time)  Medications Ordered in ED Medications - No data to display   Initial Impression / Assessment and Plan / ED Course  I have reviewed the triage vital signs and the nursing notes.  Pertinent labs & imaging results that were available during my care of the patient were reviewed by me and considered in my medical decision making (see chart for details).    Patient with left elbow swelling and ecchymoses following hyperextension injury last night. He denies pain. X-ray of left elbow joint reveals no acute fracture, mild posterior soft tissue swelling. Radial pulses 2+ bilaterally. Exam non-concerning for compartment syndrome. Have discussed  RICE protocol. Patient may continue to wear elbow sleeve for stability. Counseled patient on strict return precautions. He voices understanding and agrees at the bedside. Vital signs stable and patient is in good condition for discharge.  Final Clinical Impressions(s) / ED Diagnoses   Final diagnoses:  Elbow swelling, left  Traumatic ecchymosis of left elbow, initial encounter    New Prescriptions New Prescriptions   No medications on file     Lawrence MarseillesShrosbree, Prosperity Darrough J, PA-C 08/31/17 1659    Tegeler, Canary Brimhristopher J, MD 09/01/17 [phone]

## 2017-08-31 NOTE — Discharge Instructions (Signed)
The x-rays of your left arm show no acute fracture. There is some soft tissue swelling. Given no fracture, you may return to work.  If you have pain you may treat this with 800 mg ibuprofen every 6 hours. Continue to ice the elbow twice daily.  [facility]  Please return to the emergency department if you develop numbness/weakness in the elbow, worsening pain or development of a fever.

## 2018-09-19 IMAGING — CR DG ELBOW COMPLETE 3+V*L*
4 series · 4 of 4 positions shown · non-contrast
Comparison: Films from earlier in the same day at [REDACTED]

CLINICAL DATA: Hyperextension injury yesterday while performing
martial arts with pain, initial encounter

EXAM:
LEFT ELBOW - COMPLETE 3+ VIEW

[x elbow ap left]
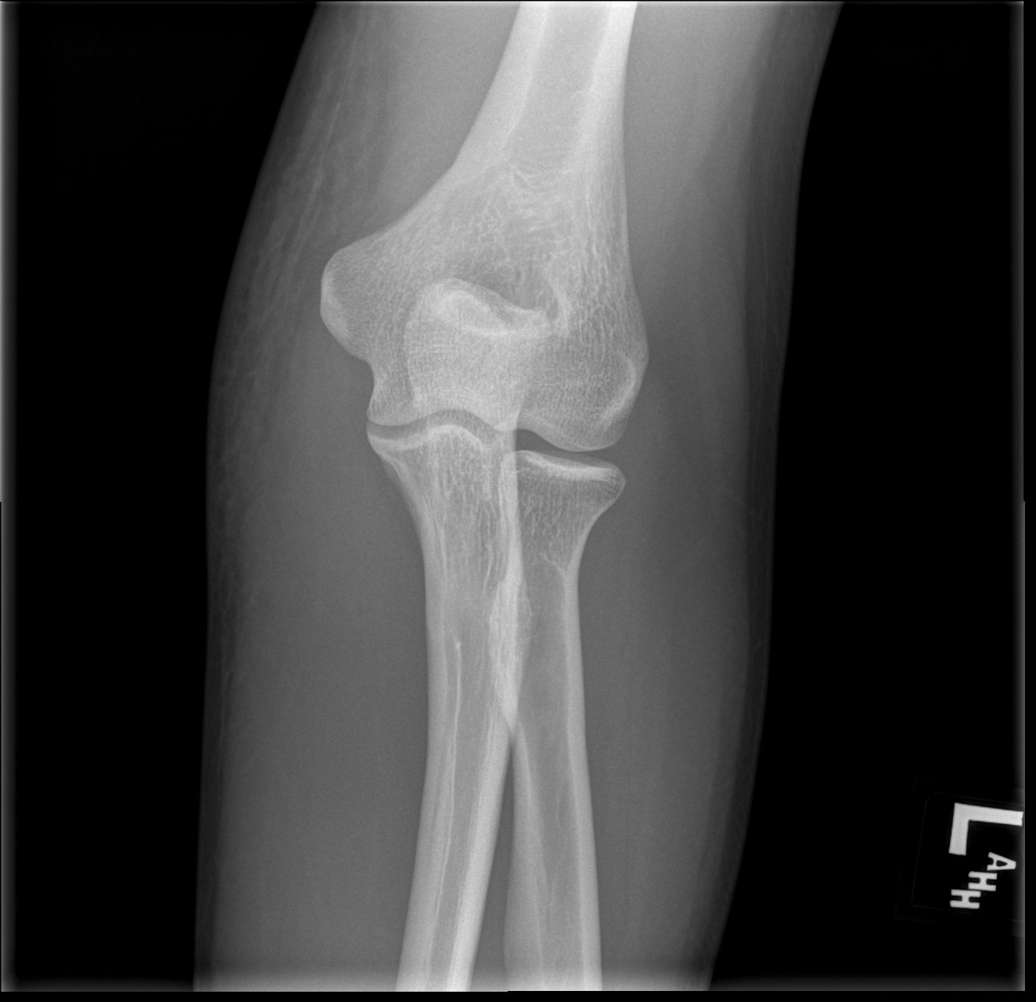

[x elbow obl left (1 of 2)]
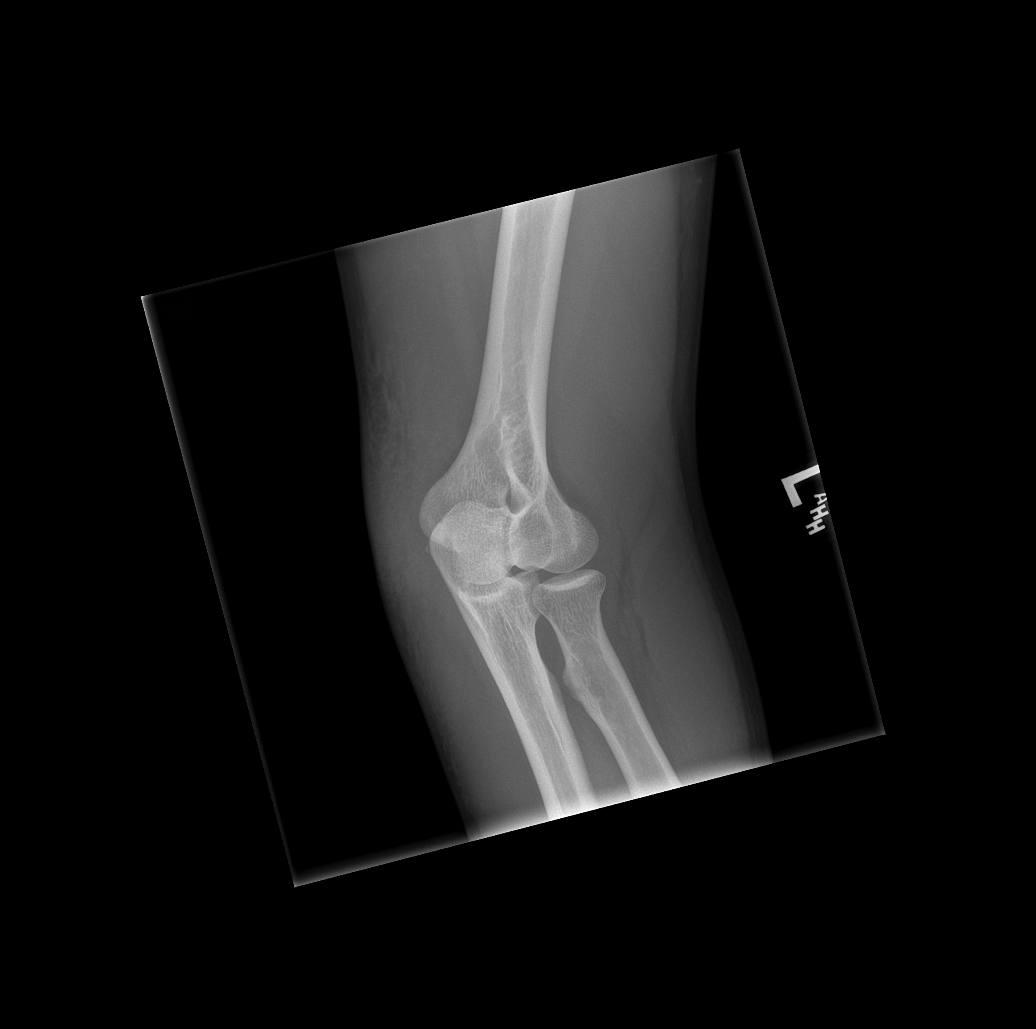

[x elbow obl left (2 of 2)]
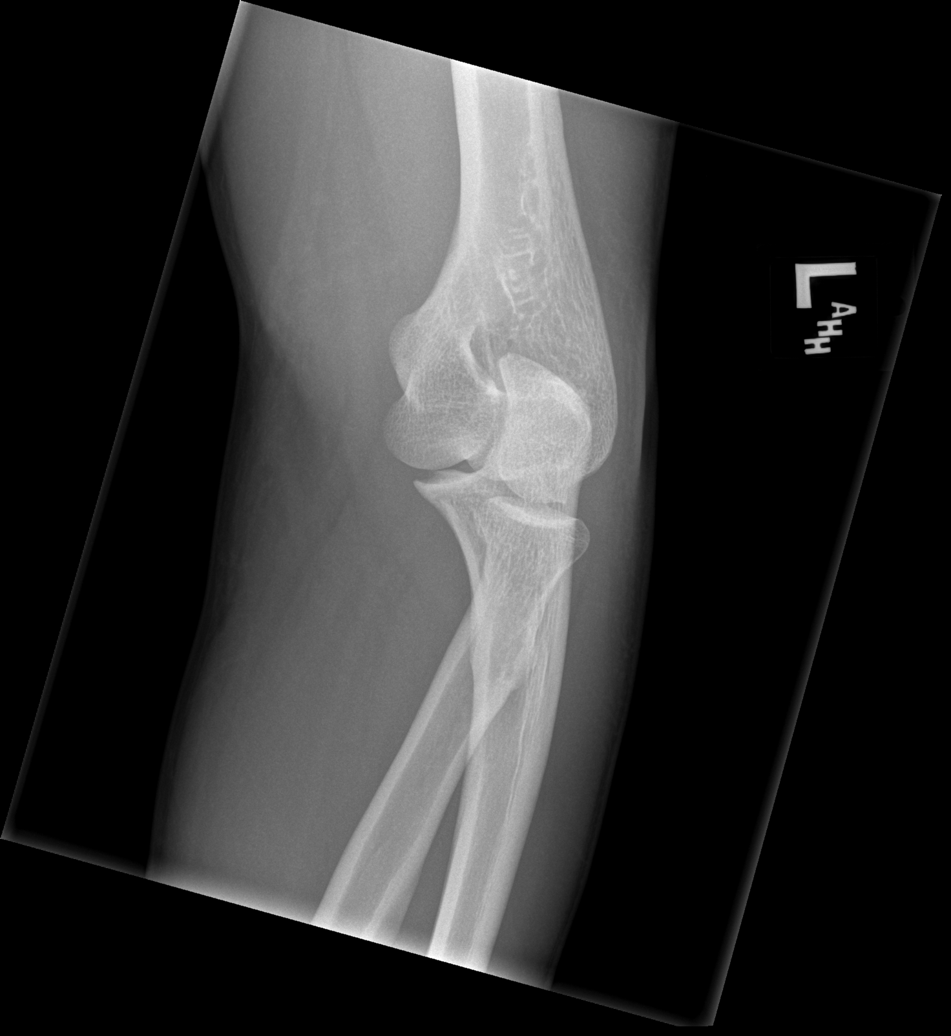

[x elbow lat left]
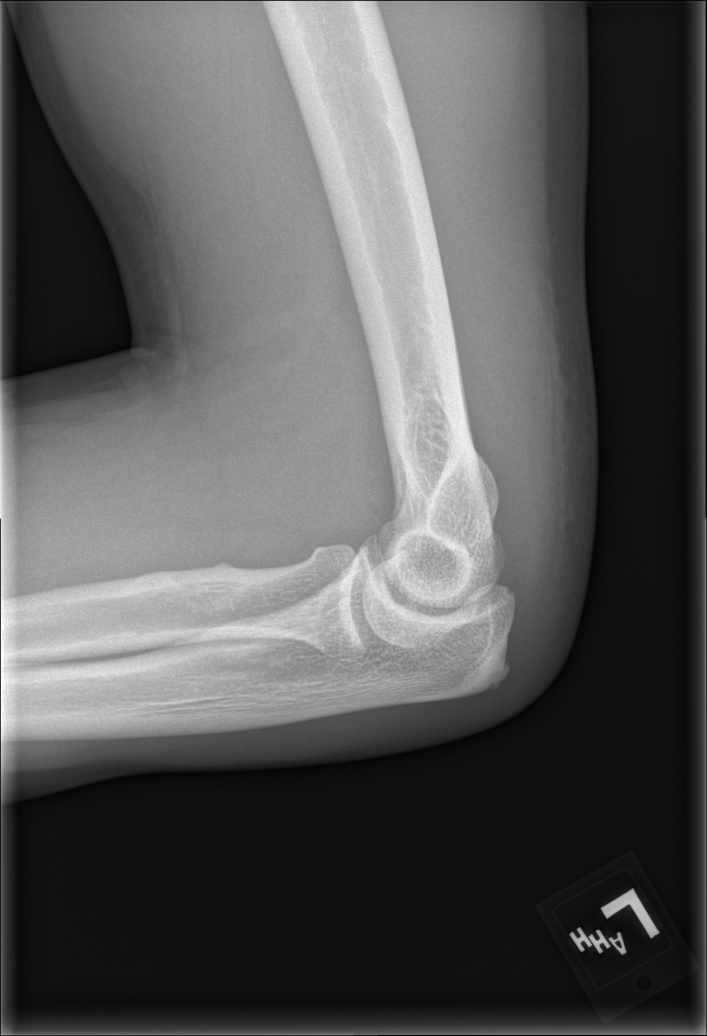

[4 of 4 positions shown; findings below may reference images not displayed]

FINDINGS: Mild posterior soft tissue swelling is noted. The joint effusion is
less well visualized on the current examination. Very mild spurring
of the olecranon is again noted. No acute fracture or dislocation is
seen. No other focal abnormality is noted.
IMPRESSION: Posterior soft tissue swelling.

Previously seen joint effusion is not as well visualized on this
exam. Again no acute bony abnormality is noted.
